# Patient Record
Sex: Male | Born: 1975 | Race: Black or African American | Hispanic: No | Marital: Single | Smoking: Current every day smoker
Health system: Southern US, Community
[De-identification: ages and names within clinical notes are randomized; demographics above are authoritative.]

## PROBLEM LIST (undated history)

## (undated) HISTORY — PX: NO PAST SURGERIES: SHX2092

---

## 2013-05-06 ENCOUNTER — Emergency Department: Payer: Self-pay | Admitting: Emergency Medicine

## 2014-06-19 ENCOUNTER — Emergency Department: Payer: Self-pay | Admitting: Emergency Medicine

## 2017-11-05 ENCOUNTER — Ambulatory Visit (INDEPENDENT_AMBULATORY_CARE_PROVIDER_SITE_OTHER): Payer: BLUE CROSS/BLUE SHIELD | Admitting: Physician Assistant

## 2017-11-05 ENCOUNTER — Encounter: Payer: Self-pay | Admitting: Physician Assistant

## 2017-11-05 VITALS — BP 122/78 | HR 88 | Temp 98.8°F | Resp 16 | Ht 67.5 in | Wt 230.0 lb

## 2017-11-05 DIAGNOSIS — Z114 Encounter for screening for human immunodeficiency virus [HIV]: Secondary | ICD-10-CM | POA: Diagnosis not present

## 2017-11-05 DIAGNOSIS — R300 Dysuria: Secondary | ICD-10-CM | POA: Diagnosis not present

## 2017-11-05 DIAGNOSIS — R3911 Hesitancy of micturition: Secondary | ICD-10-CM

## 2017-11-05 DIAGNOSIS — Z1329 Encounter for screening for other suspected endocrine disorder: Secondary | ICD-10-CM | POA: Diagnosis not present

## 2017-11-05 DIAGNOSIS — Z130 Encounter for screening for diseases of the blood and blood-forming organs and certain disorders involving the immune mechanism: Secondary | ICD-10-CM | POA: Diagnosis not present

## 2017-11-05 DIAGNOSIS — Z13220 Encounter for screening for lipoid disorders: Secondary | ICD-10-CM

## 2017-11-05 DIAGNOSIS — Z1159 Encounter for screening for other viral diseases: Secondary | ICD-10-CM

## 2017-11-05 DIAGNOSIS — Z23 Encounter for immunization: Secondary | ICD-10-CM

## 2017-11-05 DIAGNOSIS — I10 Essential (primary) hypertension: Secondary | ICD-10-CM | POA: Diagnosis not present

## 2017-11-05 MED ORDER — HYDROCHLOROTHIAZIDE 25 MG PO TABS: 25.0000 mg | ORAL_TABLET | Freq: Every day | ORAL | 1 refills | Status: AC

## 2017-11-05 NOTE — Patient Instructions (Signed)

## 2017-11-05 NOTE — Progress Notes (Signed)
Patient: Kyle Proctor    DOB: 1976-04-03   42 y.o.   MRN: [id number] Visit Date: 11/05/2017  Today's Provider: Trey SailorsAdriana M Pollak, PA-C   Chief Complaint  Patient presents with  . Establish Care  . Hypertension   Subjective:    Kyle Proctor is a 42 y/o man presenting today to establish care. He is living with his fiance Kenney Housemananya. Has been together for five years. [place] in [place], [place]. Getting married next February. He has three children ages 4922, [id number] and [id number] who have no health issues. Two are in school, one is starting business.  He does smoke, has quit before for six years, did so without any aid. He plans to quit again, does not desire cessation aid.  He has a history of HTN that has been treated with HCTZ x 2 years. Tolerates this medication well, BP well controlled.   He also reports infrequent urinary hesitancy as well as testicular pain. He says this might happen every couple of weeks, at which time he will have some pain radiating into this testicles. No fevers, chills, back pain associated with this. Denies any lumps or masses. No history of kidney stones. No surgeries. No penile discharge, endorses single Proctor sexual partner.   He was incarcerated for a period of time, tested negative for HIV and hepatitis C one year ago. Would like to be tested again.   Hypertension  This is a chronic problem. The problem is controlled. Associated symptoms include malaise/fatigue. Pertinent negatives include no anxiety, blurred vision, chest pain, headaches, neck pain, orthopnea, palpitations, peripheral edema, PND, shortness of breath or sweats. There are no associated agents to hypertension. Risk factors for coronary artery disease include smoking/tobacco exposure. Past treatments include diuretics. There are no compliance problems.        No Known Allergies   Current Outpatient Medications:  .  hydrochlorothiazide (HYDRODIURIL) 25 MG tablet, Take 25 mg by mouth daily., Disp:  , Rfl:   Review of Systems  Constitutional: Positive for fatigue and malaise/fatigue. Negative for activity change, appetite change, chills, diaphoresis, fever and unexpected weight change.  HENT: Negative.   Eyes: Negative.  Negative for blurred vision.  Respiratory: Negative.  Negative for shortness of breath.   Cardiovascular: Negative.  Negative for chest pain, palpitations, orthopnea and PND.  Gastrointestinal: Negative.   Endocrine: Negative.   Genitourinary: Positive for difficulty urinating (Comes and goes) and testicular pain. Negative for decreased urine volume, discharge, dysuria, enuresis, flank pain, frequency, genital sores, hematuria, penile pain, penile swelling, scrotal swelling and urgency.  Musculoskeletal: Positive for back pain. Negative for arthralgias, gait problem, joint swelling, myalgias, neck pain and neck stiffness.  Skin: Negative.   Allergic/Immunologic: Negative.   Neurological: Negative.  Negative for headaches.  Hematological: Negative.   Psychiatric/Behavioral: Negative.     Social History   Tobacco Use  . Smoking status: Current Every Day Smoker    Packs/day: 0.25    Years: 23.00    Pack years: 5.75    Types: Cigarettes  . Smokeless tobacco: Never Used  Substance Use Topics  . Alcohol use: No    Frequency: Never   Objective:   BP 122/78 (BP Location: Right Arm, Patient Position: Sitting, Cuff Size: Large)   Pulse 88   Temp 98.8 F (37.1 C) (Oral)   Resp 16   Ht 5' 7.5" (1.715 m)   Wt 230 lb (104.3 kg)   BMI 35.49 kg/m  Vitals:   11/05/17 1418  BP: 122/78  Pulse: 88  Resp: 16  Temp: 98.8 F (37.1 C)  TempSrc: Oral  Weight: 230 lb (104.3 kg)  Height: 5' 7.5" (1.715 m)     Physical Exam  Constitutional: He is oriented to person, place, and time. He appears well-developed and well-nourished.  Cardiovascular: Normal rate and regular rhythm.  Pulmonary/Chest: Effort normal and breath sounds normal.  Neurological: He is alert and  oriented to person, place, and time.  Skin: Skin is warm and dry.  Psychiatric: He has a normal mood and affect. His behavior is normal.        Assessment & Plan:     1. Urinary hesitancy  - Ambulatory referral to Urology - PSA - POCT Urinalysis Dipstick - CULTURE, URINE COMPREHENSIVE - Urinalysis, microscopic only  2. Encounter for screening for HIV  - HIV antibody (with reflex)  3. Encounter for hepatitis C screening test for low risk patient  - Hepatitis c antibody (reflex)  4. Screening for deficiency anemia  - CBC with Differential  5. Screening for thyroid disorder  - TSH  6. Screening cholesterol level  - Lipid Profile  7. Hypertension, unspecified type  - Comprehensive Metabolic Panel (CMET) - hydrochlorothiazide (HYDRODIURIL) 25 MG tablet; Take 1 tablet (25 mg total) by mouth daily.  Dispense: 90 tablet; Refill: 1  8. Dysuria  - POCT Urinalysis Dipstick - CULTURE, URINE COMPREHENSIVE  9. Need for Tdap vaccination  - Tdap vaccine greater than or equal to 7yo IM  Return in about 6 months (around 05/05/2018) for HTN.  The entirety of the information documented in the History of Present Illness, Review of Systems and Physical Exam were personally obtained by me. Portions of this information were initially documented by Kavin Leech, CMA and reviewed by me for thoroughness and accuracy.        Trey Sailors, PA-C  [facility]

## 2017-11-06 DIAGNOSIS — I10 Essential (primary) hypertension: Secondary | ICD-10-CM | POA: Insufficient documentation

## 2017-11-06 LAB — URINALYSIS, MICROSCOPIC ONLY
Bacteria, UA: NONE SEEN
Casts: NONE SEEN /lpf
Epithelial Cells (non renal): NONE SEEN /hpf (ref 0–10)

## 2017-11-06 LAB — POCT URINALYSIS DIPSTICK
Bilirubin, UA: NEGATIVE
Glucose, UA: NEGATIVE
Ketones, UA: NEGATIVE
Leukocytes, UA: NEGATIVE
Nitrite, UA: NEGATIVE
Protein, UA: NEGATIVE
Spec Grav, UA: >=1.03 — AB (ref 1.010–1.025)
Urobilinogen, UA: 0.2 E.U./dL
pH, UA: 5 (ref 5.0–8.0)

## 2017-11-07 LAB — CULTURE, URINE COMPREHENSIVE

## 2017-11-09 ENCOUNTER — Telehealth: Payer: Self-pay

## 2017-11-09 LAB — COMPREHENSIVE METABOLIC PANEL
ALT: 25 IU/L (ref 0–44)
AST: 18 IU/L (ref 0–40)
Albumin/Globulin Ratio: 1.6 (ref 1.2–2.2)
Albumin: 4.5 g/dL (ref 3.5–5.5)
Alkaline Phosphatase: 109 IU/L (ref 39–117)
BUN/Creatinine Ratio: 11 (ref 9–20)
BUN: 11 mg/dL (ref 6–24)
Bilirubin Total: 0.3 mg/dL (ref 0.0–1.2)
CO2: 22 mmol/L (ref 20–29)
Calcium: 9.3 mg/dL (ref 8.7–10.2)
Chloride: 103 mmol/L (ref 96–106)
Creatinine, Ser: 0.99 mg/dL (ref 0.76–1.27)
GFR calc Af Amer: 110 mL/min/{1.73_m2} (ref >59)
GFR calc non Af Amer: 95 mL/min/{1.73_m2} (ref >59)
Globulin, Total: 2.9 g/dL (ref 1.5–4.5)
Glucose: 95 mg/dL (ref 65–99)
Potassium: 4.2 mmol/L (ref 3.5–5.2)
Sodium: 142 mmol/L (ref 134–144)
Total Protein: 7.4 g/dL (ref 6.0–8.5)

## 2017-11-09 LAB — CBC WITH DIFFERENTIAL/PLATELET
Basophils Absolute: 0 10*3/uL (ref 0.0–0.2)
Basos: 0 %
EOS (ABSOLUTE): 0.2 10*3/uL (ref 0.0–0.4)
Eos: 2 %
Hematocrit: 41.9 % (ref 37.5–51.0)
Hemoglobin: 14.1 g/dL (ref 13.0–17.7)
Immature Grans (Abs): 0 10*3/uL (ref 0.0–0.1)
Immature Granulocytes: 0 %
Lymphocytes Absolute: 3 10*3/uL (ref 0.7–3.1)
Lymphs: 42 %
MCH: 32 pg (ref 26.6–33.0)
MCHC: 33.7 g/dL (ref 31.5–35.7)
MCV: 95 fL (ref 79–97)
Monocytes Absolute: 0.5 10*3/uL (ref 0.1–0.9)
Monocytes: 7 %
Neutrophils Absolute: 3.4 10*3/uL (ref 1.4–7.0)
Neutrophils: 49 %
Platelets: 253 10*3/uL (ref 150–379)
RBC: 4.4 x10E6/uL (ref 4.14–5.80)
RDW: 13.8 % (ref 12.3–15.4)
WBC: 7.1 10*3/uL (ref 3.4–10.8)

## 2017-11-09 LAB — HEPATITIS C ANTIBODY (REFLEX): HCV Ab: 0.1 s/co ratio (ref 0.0–0.9)

## 2017-11-09 LAB — LIPID PANEL
Chol/HDL Ratio: 5.1 ratio — ABNORMAL HIGH (ref 0.0–5.0)
Cholesterol, Total: 182 mg/dL (ref 100–199)
HDL: 36 mg/dL — ABNORMAL LOW (ref >39)
LDL Calculated: 127 mg/dL — ABNORMAL HIGH (ref 0–99)
Triglycerides: 93 mg/dL (ref 0–149)
VLDL Cholesterol Cal: 19 mg/dL (ref 5–40)

## 2017-11-09 LAB — HIV ANTIBODY (ROUTINE TESTING W REFLEX): HIV Screen 4th Generation wRfx: NONREACTIVE

## 2017-11-09 LAB — HCV COMMENT:

## 2017-11-09 LAB — TSH: TSH: 1.39 u[IU]/mL (ref 0.450–4.500)

## 2017-11-09 LAB — PSA: Prostate Specific Ag, Serum: 0.6 ng/mL (ref 0.0–4.0)

## 2017-11-09 NOTE — Telephone Encounter (Signed)
Left message advising pt.  (Per DPR)  Thanks,   -Laura  

## 2017-11-09 NOTE — Telephone Encounter (Signed)
-----   Message from Trey SailorsAdriana M Pollak, [place] sent at 11/08/2017  8:41 AM EST ----- Urine culture negative, continue follow up with urology.

## 2017-11-12 ENCOUNTER — Telehealth: Payer: Self-pay

## 2017-11-12 NOTE — Telephone Encounter (Signed)
[id number] 

## 2017-11-12 NOTE — Telephone Encounter (Signed)
-----   Message from Trey SailorsAdriana M Pollak, [place] sent at 11/12/2017  4:11 PM EST ----- Labs normal except for cholesterol panel. With all his risk factors, he does qualify for statin therapy. However, if he did quit smoking, his risk would decrease so that he didn't need cholesterol medication. Would recommend eliminating statin and following up at visit.

## 2017-11-14 NOTE — Telephone Encounter (Signed)
LMTCB 11/14/2017  Thanks,   -Raelee Rossmann  

## 2017-11-19 NOTE — Telephone Encounter (Signed)
Left message advising pt of lab results (Per DPR)  Thanks,   -Ali Mclaurin  

## 2017-11-21 ENCOUNTER — Ambulatory Visit: Payer: BLUE CROSS/BLUE SHIELD | Admitting: Urology

## 2017-11-21 ENCOUNTER — Encounter: Payer: Self-pay | Admitting: Urology

## 2017-11-29 ENCOUNTER — Encounter: Payer: Self-pay | Admitting: Physician Assistant

## 2017-11-29 ENCOUNTER — Ambulatory Visit (INDEPENDENT_AMBULATORY_CARE_PROVIDER_SITE_OTHER): Payer: BLUE CROSS/BLUE SHIELD | Admitting: Physician Assistant

## 2017-11-29 VITALS — BP 138/82 | HR 82 | Temp 98.3°F | Resp 16 | Ht 68.0 in | Wt 228.0 lb

## 2017-11-29 DIAGNOSIS — R05 Cough: Secondary | ICD-10-CM | POA: Diagnosis not present

## 2017-11-29 DIAGNOSIS — R509 Fever, unspecified: Secondary | ICD-10-CM | POA: Diagnosis not present

## 2017-11-29 DIAGNOSIS — R059 Cough, unspecified: Secondary | ICD-10-CM

## 2017-11-29 LAB — POCT INFLUENZA A/B
Influenza A, POC: NEGATIVE
Influenza B, POC: NEGATIVE

## 2017-11-29 MED ORDER — OSELTAMIVIR PHOSPHATE 75 MG PO CAPS: 75.0000 mg | ORAL_CAPSULE | Freq: Two times a day (BID) | ORAL | 0 refills | Status: AC

## 2017-11-29 NOTE — Patient Instructions (Signed)
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
## 2018-05-10 ENCOUNTER — Ambulatory Visit: Payer: BLUE CROSS/BLUE SHIELD | Admitting: Physician Assistant

## 2019-08-28 ENCOUNTER — Emergency Department: Payer: No Typology Code available for payment source

## 2019-08-28 ENCOUNTER — Other Ambulatory Visit: Payer: Self-pay

## 2019-08-28 ENCOUNTER — Encounter: Payer: Self-pay | Admitting: Emergency Medicine

## 2019-08-28 ENCOUNTER — Emergency Department
Admission: EM | Admit: 2019-08-28 | Discharge: 2019-08-28 | Disposition: A | Discharge status: Home / Self Care | Payer: No Typology Code available for payment source | Attending: Emergency Medicine | Admitting: Emergency Medicine

## 2019-08-28 DIAGNOSIS — I10 Essential (primary) hypertension: Secondary | ICD-10-CM | POA: Insufficient documentation

## 2019-08-28 DIAGNOSIS — S161XXA Strain of muscle, fascia and tendon at neck level, initial encounter: Secondary | ICD-10-CM | POA: Diagnosis not present

## 2019-08-28 DIAGNOSIS — Y9389 Activity, other specified: Secondary | ICD-10-CM | POA: Diagnosis not present

## 2019-08-28 DIAGNOSIS — F17210 Nicotine dependence, cigarettes, uncomplicated: Secondary | ICD-10-CM | POA: Insufficient documentation

## 2019-08-28 DIAGNOSIS — Y92410 Unspecified street and highway as the place of occurrence of the external cause: Secondary | ICD-10-CM | POA: Diagnosis not present

## 2019-08-28 DIAGNOSIS — Z79899 Other long term (current) drug therapy: Secondary | ICD-10-CM | POA: Diagnosis not present

## 2019-08-28 DIAGNOSIS — S3992XA Unspecified injury of lower back, initial encounter: Secondary | ICD-10-CM | POA: Diagnosis present

## 2019-08-28 DIAGNOSIS — S39012A Strain of muscle, fascia and tendon of lower back, initial encounter: Secondary | ICD-10-CM | POA: Insufficient documentation

## 2019-08-28 DIAGNOSIS — Y999 Unspecified external cause status: Secondary | ICD-10-CM | POA: Diagnosis not present

## 2019-08-28 MED ORDER — METHOCARBAMOL 500 MG PO TABS: ORAL_TABLET | ORAL | 0 refills | Status: AC

## 2019-08-28 MED ORDER — NAPROXEN 500 MG PO TABS: 500.0000 mg | ORAL_TABLET | Freq: Two times a day (BID) | ORAL | 0 refills | Status: AC

## 2019-08-28 MED ORDER — KETOROLAC TROMETHAMINE 30 MG/ML IJ SOLN
30.0000 mg | Freq: Once | INTRAMUSCULAR | Status: AC
  Administered 2019-08-28: 12:00:00 30 mg via INTRAMUSCULAR
  Filled 2019-08-28: qty 1

## 2019-08-28 NOTE — Discharge Instructions (Signed)
Follow-up with your primary care provider if any continued problems.  You may use ice or heat to your back and muscles as needed for discomfort.  Begin taking methocarbamol 500 mg 1 or 2 tablets every 6 hours as needed for muscle spasms.  Do not drive or operate machinery while taking this medication as it could cause drowsiness and increase your risk for injury.  The naproxen is twice a day with food.  This is for inflammation and pain.  You may also take Tylenol in addition to these medications if needed for pain.
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
